# Patient Record
Sex: Male | Born: 1970 | Race: White | Hispanic: No | Marital: Married | Smoking: Never smoker
Health system: Southern US, Community
[De-identification: ages and names within clinical notes are randomized; demographics above are authoritative.]

## PROBLEM LIST (undated history)

## (undated) DIAGNOSIS — N390 Urinary tract infection, site not specified: Secondary | ICD-10-CM

## (undated) DIAGNOSIS — M419 Scoliosis, unspecified: Secondary | ICD-10-CM

## (undated) DIAGNOSIS — R011 Cardiac murmur, unspecified: Secondary | ICD-10-CM

## (undated) DIAGNOSIS — K219 Gastro-esophageal reflux disease without esophagitis: Secondary | ICD-10-CM

## (undated) DIAGNOSIS — B019 Varicella without complication: Secondary | ICD-10-CM

## (undated) DIAGNOSIS — J45909 Unspecified asthma, uncomplicated: Secondary | ICD-10-CM

## (undated) HISTORY — DX: Urinary tract infection, site not specified: N39.0

## (undated) HISTORY — DX: Cardiac murmur, unspecified: R01.1

## (undated) HISTORY — DX: Scoliosis, unspecified: M41.9

## (undated) HISTORY — DX: Unspecified asthma, uncomplicated: J45.909

## (undated) HISTORY — PX: EXTERNAL EAR SURGERY: SHX627

## (undated) HISTORY — DX: Varicella without complication: B01.9

## (undated) HISTORY — PX: TONSILLECTOMY: SUR1361

## (undated) HISTORY — DX: Gastro-esophageal reflux disease without esophagitis: K21.9

---
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
## 2003-06-24 ENCOUNTER — Inpatient Hospital Stay (HOSPITAL_COMMUNITY): Admission: EM | Admit: 2003-06-24 | Discharge: 2003-06-24 | Payer: Self-pay | Admitting: Emergency Medicine

## 2011-11-15 ENCOUNTER — Ambulatory Visit: Payer: Self-pay | Admitting: General Practice

## 2016-07-19 ENCOUNTER — Other Ambulatory Visit: Payer: Self-pay | Admitting: Otolaryngology

## 2016-07-19 DIAGNOSIS — R430 Anosmia: Secondary | ICD-10-CM

## 2017-01-05 DIAGNOSIS — L02611 Cutaneous abscess of right foot: Secondary | ICD-10-CM | POA: Diagnosis not present

## 2017-09-22 ENCOUNTER — Ambulatory Visit (HOSPITAL_COMMUNITY)
Admission: EM | Admit: 2017-09-22 | Discharge: 2017-09-22 | Disposition: A | Discharge status: Home / Self Care | Payer: No Typology Code available for payment source | Attending: Emergency Medicine | Admitting: Emergency Medicine

## 2017-09-22 ENCOUNTER — Encounter (HOSPITAL_COMMUNITY): Payer: Self-pay | Admitting: Emergency Medicine

## 2017-09-22 ENCOUNTER — Other Ambulatory Visit: Payer: Self-pay

## 2017-09-22 DIAGNOSIS — L03818 Cellulitis of other sites: Secondary | ICD-10-CM

## 2017-09-22 DIAGNOSIS — L0291 Cutaneous abscess, unspecified: Secondary | ICD-10-CM

## 2017-09-22 MED ORDER — SULFAMETHOXAZOLE-TRIMETHOPRIM 800-160 MG PO TABS: 1.0000 | ORAL_TABLET | Freq: Two times a day (BID) | ORAL | 0 refills | Status: DC

## 2017-09-22 NOTE — ED Triage Notes (Signed)
C/o two "knots" that have drained in groin area onset Monday

## 2017-09-22 NOTE — ED Provider Notes (Addendum)
[facility]    CSN: [id number] Arrival date & time: 09/22/17  [id number]     History   Chief Complaint Chief Complaint  Patient presents with  . Abscess    HPI Erik Hodge is a 47 y.o. male.   Pt states that he is on HRT testosterone cream and has to shave the groin area to apply this with a razor. Has noticed an red "pimple" area that he noticed 4 days ago. It popped and began to draining. Pt has noticed that it has caused some redness adject to area. Denies any n/v/d      History reviewed. No pertinent past medical history.  There are no active problems to display for this patient.   Past Surgical History:  Procedure Laterality Date  . EXTERNAL EAR SURGERY    . TONSILLECTOMY         Home Medications    Prior to Admission medications   Not on File    Family History No family history on file.  Social History Social History   Tobacco Use  . Smoking status: Never Smoker  . Smokeless tobacco: Never Used  Substance Use Topics  . Alcohol use: Not Currently  . Drug use: Never     Allergies   Patient has no known allergies.   Review of Systems Review of Systems  Respiratory: Negative.   Cardiovascular: Negative.   Gastrointestinal: Negative.   Genitourinary:       Pain, swelling, drainage from area on RT side of testicle,   Skin: Positive for wound.       RT side of testicle   Neurological: Negative.      Physical Exam Triage Vital Signs ED Triage Vitals  Enc Vitals Group     BP 09/22/17 1036 129/74     Pulse Rate 09/22/17 1036 66     Resp --      Temp 09/22/17 1036 98.3 F (36.8 C)     Temp Source 09/22/17 1036 Oral     SpO2 09/22/17 1036 96 %     Weight --      Height --      Head Circumference --      Peak Flow --      Pain Score 09/22/17 1033 5     Pain Loc --      Pain Edu? --      Excl. in GC? --    No data found.  Updated Vital Signs BP 129/74   Pulse 66   Temp 98.3 F (36.8 C) (Oral)   SpO2 96%    Visual Acuity     Physical Exam  Cardiovascular: Normal rate and regular rhythm.  Pulmonary/Chest: Effort normal and breath sounds normal.  Abdominal: Soft.  Genitourinary:  Genitourinary Comments: Testicle RT side has a pin point area with thick  drainage, slight erythema noted to surrounding tissues, tenderness to touch   Skin: Skin is warm. Capillary refill takes less than 2 seconds. There is erythema.  Rt testicle pin point area ,      UC Treatments / Results  Labs (all labs ordered are listed, but only abnormal results are displayed) Labs Reviewed - No data to display  EKG None Radiology No results found.  Procedures Procedures (including critical care time)  Medications Ordered in UC Medications - No data to display   Initial Impression / Assessment and Plan / UC Course  I have reviewed the triage vital signs and the nursing notes.  Pertinent  labs & imaging results that were available during my care of the patient were reviewed by me and considered in my medical decision making (see chart for details).     May use warm compresses  Avoid using tape to skin  Take full dose of abx  May take motrin or tylenol for pain  Avoid using razor to shave the area   Final Clinical Impressions(s) / UC Diagnoses   Final diagnoses:  None    ED Discharge Orders    None       Controlled Substance Prescriptions [place] Controlled Substance Registry consulted? Not Applicable   Coralyn Mark, NP 09/22/17 1122    Coralyn Mark, NP 09/22/17 1140    Coralyn Mark, NP 09/22/17 1209

## 2017-09-22 NOTE — ED Triage Notes (Signed)
And states is having irritation

## 2017-09-22 NOTE — Discharge Instructions (Signed)
May use warm compresses  Avoid using tape to skin  Take full dose of abx  May take motrin or tylenol for pain  Avoid using razor to shave the area

## 2017-11-06 ENCOUNTER — Other Ambulatory Visit (HOSPITAL_COMMUNITY)
Admission: RE | Admit: 2017-11-06 | Discharge: 2017-11-06 | Disposition: A | Discharge status: Home / Self Care | Payer: No Typology Code available for payment source | Source: Ambulatory Visit | Attending: Infectious Disease | Admitting: Infectious Disease

## 2017-11-06 ENCOUNTER — Ambulatory Visit (INDEPENDENT_AMBULATORY_CARE_PROVIDER_SITE_OTHER): Payer: No Typology Code available for payment source | Admitting: Pharmacist

## 2017-11-06 DIAGNOSIS — Z7251 High risk heterosexual behavior: Secondary | ICD-10-CM | POA: Diagnosis not present

## 2017-11-06 NOTE — Progress Notes (Signed)
Date:  11/06/2017   HPI: Erik Hodge is a 47 y.o. male who presents to the [facility] pharmacy clinic to discuss and initiate PrEP.  Insured      Uninsured     There are no active problems to display for this patient.   Patient's Medications  New Prescriptions   No medications on file  Previous Medications   No medications on file  Modified Medications   No medications on file  Discontinued Medications   SULFAMETHOXAZOLE-TRIMETHOPRIM (BACTRIM DS,SEPTRA DS) 800-160 MG TABLET    Take 1 tablet by mouth 2 (two) times daily.   TESTOSTERONE (ANDROGEL) 50 MG/5GM (1%) GEL    Place 5 g onto the skin daily.    Allergies: No Known Allergies  Past Medical History: No past medical history on file.  Social History: Social History   Socioeconomic History  . Marital status: Married    Spouse name: Not on file  . Number of children: Not on file  . Years of education: Not on file  . Highest education level: Not on file  Occupational History  . Not on file  Social Needs  . Financial resource strain: Not on file  . Food insecurity:    Worry: Not on file    Inability: Not on file  . Transportation needs:    Medical: Not on file    Non-medical: Not on file  Tobacco Use  . Smoking status: Never Smoker  . Smokeless tobacco: Never Used  Substance and Sexual Activity  . Alcohol use: Not Currently  . Drug use: Never  . Sexual activity: Not on file  Lifestyle  . Physical activity:    Days per week: Not on file    Minutes per session: Not on file  . Stress: Not on file  Relationships  . Social connections:    Talks on phone: Not on file    Gets together: Not on file    Attends religious service: Not on file    Active member of club or organization: Not on file    Attends meetings of clubs or organizations: Not on file    Relationship status: Not on file  Other Topics Concern  . Not on file  Social History Narrative  . Not on file    CHL HIV PREP FLOWSHEET RESULTS  11/06/2017  Insurance Status Insured  Gender at birth Male  Gender identity cis-Male  Risk for HIV In sexual relationship with HIV+ partner  Sex Partners Women only  # sex partners past 3-6 mos 1-3  Sex activity preferences Insertive  Condom use No  Treated for STI? No  HIV symptoms? N/A  PrEP Eligibility Substantial risk for HIV    Labs:  SCr: No results found for: CREATININE HIV No results found for: HIV Hepatitis B No results found for: HEPBSAB, HEPBSAG, HEPBCAB Hepatitis C No results found for: HEPCAB, HCVRNAPCRQN Hepatitis A No results found for: HAV RPR and STI No results found for: LABRPR, RPRTITER  No flowsheet data found.  HIV Pre-Exposure Prophylaxis (PrEP): Patient's risk for HIV: HIV + wife Sexual partner preference: women Number of sexual partners in the last 3 months: 1 Condom use? no Symptoms of acute HIV? none Last date of BMET: N/A Last date of STI testing: N/A Last negative HIV antibody test: N/A  Assessment: [name] is here today to discuss and initiate PrEP.  His wife is newly diagnosed and was just in the hospital with pneumonia and diagnosed with HIV at that time.  Her CD4 count was 10 at diagnosis, and she was started on Biktarvy by Marcos Eke, our ID NP.  He currently has condomless sex with his wife and is worried about being HIV positive.  His oral swab was negative for HIV a few weeks ago.   I discussed PrEP with him including the need to take it daily to prevent HIV transmission.  I also explained to him that it is very important that his wife takes her Biktarvy every day.  I explained that once her HIV viral load is undetectable, then the risk of HIV transmission is extremely low. I also explained that if her viral load was undetectable and he was taking Truvada for PrEP, then his risk is essentially zero.  He is very interested in starting.  I also discussed long term kidney and bone damage with Truvada and explained that Descovy will hopefully  be approved soon.  He does not have any other medical conditions and is only taking testosterone cream. He stated to me that he was worried about his kidney function because he used to take anabolic steroids a lot.  He has never been told he had kidney issues or anything. I will get all baseline testing today including urine gc/chlamydia testing, RPR, hepatitis serologies, BMET, and HIV antibody.  I will also send a HIV viral load since he is very worried and has been having unprotected sex with his wife who just started on medications. [facility] has [facility], so he will fill at [facility] when his labs come back.  I will see him again in ~30 days then every 3 months after that.  Plan: - STI testing, BMET, Hepatitis serologies, HIV antibody - Truvada x 30 days if HIV negative - F/u with me again 6/13 at 9am  Clover Feehan L. Carle Fenech, PharmD, AAHIVP, CPP Infectious Diseases Clinical Pharmacist [facility] 11/06/2017, 10:57 AM

## 2017-11-07 ENCOUNTER — Telehealth: Payer: Self-pay | Admitting: Pharmacist

## 2017-11-07 DIAGNOSIS — Z7251 High risk heterosexual behavior: Secondary | ICD-10-CM

## 2017-11-07 LAB — HIV ANTIBODY (ROUTINE TESTING W REFLEX): HIV 1&2 Ab, 4th Generation: NONREACTIVE

## 2017-11-07 LAB — BASIC METABOLIC PANEL
BUN: 14 mg/dL (ref 7–25)
CALCIUM: 9.3 mg/dL (ref 8.6–10.3)
CHLORIDE: 105 mmol/L (ref 98–110)
CO2: 24 mmol/L (ref 20–32)
Creat: 1.11 mg/dL (ref 0.60–1.35)
Glucose, Bld: 108 mg/dL — ABNORMAL HIGH (ref 65–99)
POTASSIUM: 4.5 mmol/L (ref 3.5–5.3)
SODIUM: 140 mmol/L (ref 135–146)

## 2017-11-07 LAB — HEPATITIS C ANTIBODY
Hepatitis C Ab: NONREACTIVE
SIGNAL TO CUT-OFF: 0.06 (ref <1.00)

## 2017-11-07 LAB — HEPATITIS B SURFACE ANTIBODY,QUALITATIVE: HEP B S AB: NONREACTIVE

## 2017-11-07 LAB — HEPATITIS B SURFACE ANTIGEN: Hepatitis B Surface Ag: NONREACTIVE

## 2017-11-07 LAB — URINE CYTOLOGY ANCILLARY ONLY
Chlamydia: NEGATIVE
Neisseria Gonorrhea: NEGATIVE

## 2017-11-07 LAB — HEPATITIS A ANTIBODY, TOTAL: HEPATITIS A AB,TOTAL: NONREACTIVE

## 2017-11-07 LAB — RPR: RPR Ser Ql: NONREACTIVE

## 2017-11-07 MED ORDER — EMTRICITABINE-TENOFOVIR DF 200-300 MG PO TABS
1.0000 | ORAL_TABLET | Freq: Every day | ORAL | 0 refills | Status: DC
Start: 2017-11-07 — End: 2019-09-24

## 2017-11-07 NOTE — Telephone Encounter (Signed)
Called [name] and left a message that his lab results (HIV antibody) was negative.  Will send in 30 days of Truvada to [facility].

## 2017-11-08 ENCOUNTER — Telehealth: Payer: Self-pay | Admitting: Pharmacist Clinician (PhC)/ Clinical Pharmacy Specialist

## 2017-11-08 LAB — HIV-1 RNA ULTRAQUANT REFLEX TO GENTYP+

## 2017-11-08 NOTE — Telephone Encounter (Signed)
Erik Hodge called back to ask if he can wait to start PrEP next month when he comes here for a repeat test. He really wants to make sure that he doesn't have HIV first because of unprotected sex before his wife found out her status. Told him it was fine to delay start until the next visit on 6/13.

## 2017-12-07 ENCOUNTER — Ambulatory Visit: Payer: No Typology Code available for payment source

## 2018-02-08 ENCOUNTER — Ambulatory Visit (INDEPENDENT_AMBULATORY_CARE_PROVIDER_SITE_OTHER): Payer: No Typology Code available for payment source | Admitting: Physician Assistant

## 2018-02-08 ENCOUNTER — Encounter: Payer: Self-pay | Admitting: Physician Assistant

## 2018-02-08 VITALS — BP 140/88 | HR 76 | Temp 98.0°F | Ht 70.0 in | Wt 191.0 lb

## 2018-02-08 DIAGNOSIS — M419 Scoliosis, unspecified: Secondary | ICD-10-CM

## 2018-02-08 DIAGNOSIS — A630 Anogenital (venereal) warts: Secondary | ICD-10-CM

## 2018-02-08 DIAGNOSIS — Z7689 Persons encountering health services in other specified circumstances: Secondary | ICD-10-CM | POA: Diagnosis not present

## 2018-02-08 DIAGNOSIS — M5441 Lumbago with sciatica, right side: Secondary | ICD-10-CM | POA: Diagnosis not present

## 2018-02-08 DIAGNOSIS — G8929 Other chronic pain: Secondary | ICD-10-CM

## 2018-02-08 DIAGNOSIS — Z114 Encounter for screening for human immunodeficiency virus [HIV]: Secondary | ICD-10-CM | POA: Diagnosis not present

## 2018-02-08 DIAGNOSIS — Z7251 High risk heterosexual behavior: Secondary | ICD-10-CM

## 2018-02-08 NOTE — Patient Instructions (Signed)
It was great to see you!  You will be contacted about your referral to dermatology. You can make an appointment with Dr. Berline Choughigby at your convenience starting as early as tomorrow.  Let's follow-up in 1 month for a physical exam, sooner if you have concerns.  Take care,  Jarold MottoSamantha Kortland Nichols PA-C

## 2018-02-08 NOTE — Progress Notes (Signed)
Erik Hodge is a 47 y.o. male here for a new problem.   History of Present Illness:   Chief Complaint  Patient presents with  . New Patient (Initial Visit)  . Establish Care  . Back Pain  . Genital Warts    Acute Concerns: Back pain -- R lower lumbar area, has shooting pain down his R leg --> thinks that 3-4 months ago he pulled something when dead-lifting at the gym. Put inserts in his shoes yesterday and this helped. Saw a chiropractor for an eval and was told that the chiropractor is requiring a referral due to his insurance. Currently taking Aleve or Tylenol with some relief. Does not want to take strong pain medications (strong family history of addiction.) Genital warts -- 1 lesion a year ago and then a few more recently. 15-20 years ago had one that was "burned" off. Not painful. No discharge from penis. Recent STI testing in May was normal. High risk heterosexual behavior -- wife recently diagnosed with HIV earlier this year. He was prescribed Truvada by ID but never started it. He was unable to go back for a repeat HIV test to confirm negative prior to starting. Labs were most recently performed in May.  Health Maintenance: Immunizations -- requesting records Weight -- Weight: 191 lb (86.6 kg)   Depression screen [facility] 2/9 02/08/2018  Decreased Interest 0  Down, Depressed, Hopeless 0  PHQ - 2 Score 0    No flowsheet data found.  Other providers/specialists: Erik Eke -- ID  Past Medical History:  Diagnosis Date  . Asthma    as a child  . Chicken pox   . GERD (gastroesophageal reflux disease)   . Heart murmur    as a child  . Scoliosis    undiagnosed until 60 y/o, no surgery  . UTI (urinary tract infection)      Social History   Socioeconomic History  . Marital status: Married    Spouse name: Not on file  . Number of children: Not on file  . Years of education: Not on file  . Highest education level: Not on file  Occupational History  . Occupation:  Database administrator  Social Needs  . Financial resource strain: Not on file  . Food insecurity:    Worry: Not on file    Inability: Not on file  . Transportation needs:    Medical: Not on file    Non-medical: Not on file  Tobacco Use  . Smoking status: Never Smoker  . Smokeless tobacco: Never Used  Substance and Sexual Activity  . Alcohol use: Yes    Comment: Socially drinks (1 q2 wks)  . Drug use: Never  . Sexual activity: Yes  Lifestyle  . Physical activity:    Days per week: Not on file    Minutes per session: Not on file  . Stress: Not on file  Relationships  . Social connections:    Talks on phone: Not on file    Gets together: Not on file    Attends religious service: Not on file    Active member of club or organization: Not on file    Attends meetings of clubs or organizations: Not on file    Relationship status: Not on file  . Intimate partner violence:    Fear of current or ex partner: Not on file    Emotionally abused: Not on file    Physically abused: Not on file    Forced sexual activity: Not on file  Other Topics Concern  . 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 EXTERNAL EAR SURGERY    . eye surgery  1984  . INNER EAR SURGERY  1978  . TONSILECTOMY, ADENOIDECTOMY, BILATERAL MYRINGOTOMY AND TUBES  1976  . TONSILLECTOMY      Family History  Problem Relation Age of Onset  . Alcohol abuse Brother   . Depression Brother   . Drug abuse Brother   . Arthritis Maternal Grandmother   . Alcohol abuse Maternal Grandfather   . Kidney disease Maternal Grandfather   . Colon cancer Neg Hx   . Prostate cancer Neg Hx     No Known Allergies   Current Medications:   Current Outpatient Medications:  .  testosterone (ANDROGEL) 50 MG/5GM (1%) GEL, Place 5 g  onto the skin daily., Disp: , Rfl:  .  emtricitabine-tenofovir (TRUVADA) 200-300 MG tablet, Take 1 tablet by mouth daily., Disp: 30 tablet, Rfl: 0   Review of Systems:   ROS  Negative unless otherwise specified per HPI.  Vitals:   Vitals:   02/08/18 0814  BP: 140/88  Pulse: 76  Temp: 98 F (36.7 C)  TempSrc: Oral  SpO2: 95%  Weight: 191 lb (86.6 kg)  Height: 5\' 10"  (1.778 m)     Body mass index is 27.41 kg/m.  Physical Exam:   Physical Exam  Constitutional: He appears well-developed. He is cooperative.  Non-toxic appearance. He does not have a sickly appearance. He does not appear ill. No distress.  Cardiovascular: Normal rate, regular rhythm, S1 normal, S2 normal, normal heart sounds and normal pulses.  No LE edema  Pulmonary/Chest: Effort normal and breath sounds normal.  Genitourinary:  Genitourinary Comments: Numerous condyloma around base of shaft of penis; no penile discharge present. Lesions without tenderness or erythema.  Musculoskeletal:  Severely curved cervical spine with obvious deformity  No decreased ROM 2/2 pain with flexion/extension, lateral side bends, or rotation. Reproducible tenderness with deep palpation to right lumbar paraspinal muscles. No bony tenderness. No evidence of erythema, rash or ecchymosis.    Neurological: He is alert. GCS eye subscore is 4. GCS verbal subscore is 5. GCS motor subscore is 6.  Skin: Skin is warm, dry and intact.  Psychiatric: He has a normal mood and affect. His speech is normal and behavior is normal.  Nursing note and vitals reviewed.     Assessment and Plan:    Erik Hodge was seen today for new patient (initial visit), establish care, back pain and genital warts.  Diagnoses and all orders for this visit:  Encounter to establish care  Screening for HIV (human immunodeficiency virus) -     HIV antibody  Chronic right-sided low back pain with right-sided sciatica; Scoliosis, unspecified scoliosis type,  unspecified spinal region -     Ambulatory referral to Sports Medicine  Genital warts -     Ambulatory referral to Dermatology  High risk heterosexual behavior Will check HIV today per patient's request. I have reached out to Erik EkeGreg Calone, NP in ID for further recommendations on proceeding with Truvada -- may need to update labs.  . Reviewed expectations re: course of current medical issues. . Discussed self-management of symptoms. . Outlined signs and symptoms indicating need  for more acute intervention. . Patient verbalized understanding and all questions were answered. . See orders for this visit as documented in the electronic medical record. . Patient received an After-Visit Summary.    Jarold MottoSamantha Salomon Ganser, PA-C

## 2018-02-09 LAB — HIV ANTIBODY (ROUTINE TESTING W REFLEX): HIV: NONREACTIVE

## 2018-02-14 ENCOUNTER — Ambulatory Visit (INDEPENDENT_AMBULATORY_CARE_PROVIDER_SITE_OTHER): Payer: No Typology Code available for payment source | Admitting: Sports Medicine

## 2018-02-14 ENCOUNTER — Encounter: Payer: Self-pay | Admitting: Sports Medicine

## 2018-02-14 ENCOUNTER — Ambulatory Visit (INDEPENDENT_AMBULATORY_CARE_PROVIDER_SITE_OTHER): Payer: No Typology Code available for payment source

## 2018-02-14 VITALS — BP 120/80 | HR 72 | Ht 70.0 in | Wt 191.4 lb

## 2018-02-14 DIAGNOSIS — M5441 Lumbago with sciatica, right side: Secondary | ICD-10-CM | POA: Diagnosis not present

## 2018-02-14 DIAGNOSIS — M419 Scoliosis, unspecified: Secondary | ICD-10-CM

## 2018-02-14 DIAGNOSIS — M9903 Segmental and somatic dysfunction of lumbar region: Secondary | ICD-10-CM

## 2018-02-14 DIAGNOSIS — M9902 Segmental and somatic dysfunction of thoracic region: Secondary | ICD-10-CM

## 2018-02-14 DIAGNOSIS — M24551 Contracture, right hip: Secondary | ICD-10-CM | POA: Diagnosis not present

## 2018-02-14 DIAGNOSIS — G8929 Other chronic pain: Secondary | ICD-10-CM | POA: Diagnosis not present

## 2018-02-14 DIAGNOSIS — M9905 Segmental and somatic dysfunction of pelvic region: Secondary | ICD-10-CM

## 2018-02-14 DIAGNOSIS — M9908 Segmental and somatic dysfunction of rib cage: Secondary | ICD-10-CM

## 2018-02-14 DIAGNOSIS — M9904 Segmental and somatic dysfunction of sacral region: Secondary | ICD-10-CM

## 2018-02-14 NOTE — Patient Instructions (Addendum)

## 2018-02-14 NOTE — Progress Notes (Signed)
Erik FellsMichael D. Delorise Shinerigby, DO  [facility] [phone]  Erik Hodge [place] - 47 y.o. male MRN [id number]  Date of birth: 01/14/1971  Visit Date: 02/14/2018  PCP: Jarold MottoWorley, Samantha, PA   Referred by: Jarold MottoWorley, Samantha, [place]  Scribe(Hodge) for today'Hodge visit: Stevenson ClinchBrandy Coleman, CMA  SUBJECTIVE:  Erik Hodge Newcom "Erik Hodge" is here for Initial Assessment (R-sided LBP) .  Referred by: Jarold MottoSamantha Worley, PA  His R-sided LBP symptoms INITIALLY: Began 3-4 mos ago after doing dead lifts at the gym. He has been dx with scoliosis.  Described as moderate stiffness and doreness, radiating to the R leg to the heel but not into the toes. He reports a "grinding" sensation in the heel. Pain can be severe at times.  Worsened with sit-to-stand, bending at the waist.  Improved with rest. Additional associated symptoms include:     At this time symptoms show no change compared to onset, waxes and wanes.  He has been taking Aleve or Tylenol with some relief. He placed inserts into his shoes which did help a lot. He tried to see a [facility]ropractor but he didn't have a referral so they wouldn't see him.  No recent XR L-spine   REVIEW OF SYSTEMS: Reports night time disturbances. Denies fevers, chills, or night sweats. Denies unexplained weight loss. Denies personal history of cancer. Denies changes in bowel or bladder habits. Denies recent unreported falls. Denies new or worsening dyspnea or wheezing. Denies headaches or dizziness.  Reports numbness, tingling in RLE - reports mild weakness.  Denies dizziness or presyncopal episodes Denies lower extremity edema    HISTORY & PERTINENT PRIOR DATA:  Significant/pertinent history, findings, studies include:  reports that he has never smoked. He has never used smokeless tobacco. No results for input(Hodge): HGBA1C, LABURIC, CREATINE in the last 8760 hours. No specialty comments available. No problems updated.  Otherwise prior  history reviewed and updated per electronic medical record.    OBJECTIVE:  VS:  HT:5\' 10"  (177.8 cm)   WT:191 lb 6.4 oz (86.8 kg)  BMI:27.46    BP:120/80  HR:72bpm  TEMP: ( )  RESP:95 %   PHYSICAL EXAM: CONSTITUTIONAL: Well-developed, Well-nourished and In no acute distress Alert & appropriately interactive. and Not depressed or anxious appearing. RESPIRATORY: No increased work of breathing and Trachea Midline EYES: Pupils are equal., EOM intact without nystagmus. and No scleral icterus.  Lower extremities: Warm and well perfused Edema: No significant swelling or edema NEURO: unremarkable Normal associated myotomal distribution strength to manual muscle testing Normal sensation to light touch Normal and symmetric associated DTRs  BACK Exam: Very mild scoliosis with moderate thoracic kyphosis. Skin: No overlying erythema/ecchymosis  MOTOR TESTING: Intact in all LE myotomes and Able to heel and toe walk without difficutly        RIGHT    LEFT Straight leg raise-------------------------: normal, no pain                         normal, no pain       REFLEXES Right Left  DTR - L3/4 -Patellar 2+ 2+  DTR - L5/S1 - Achilles 2+ 2+     PROCEDURES & DATA REVIEWED:  X-Rays obtained today and reviewed with the patient that showed: Complete lumbar spine films show slight scoliosis with mild degenerative changes of the lower lumbar segments most focally L5-S1 as well as mild facet arthropathy.   ASSESSMENT   1. Chronic right-sided low back pain  with right-sided sciatica   2. Scoliosis, unspecified scoliosis type, unspecified spinal region   3. Right hip flexor tightness   4. Somatic dysfunction of thoracic region   5. Somatic dysfunction of lumbar region   6. Somatic dysfunction of rib cage region   7. Somatic dysfunction of pelvis region   8. Somatic dysfunction of sacral region     PLAN:          Functional low back pain  Tight hip flexors  Osteopathic  manipulation was performed today based on physical exam findings.  Please see procedure note for further information including Osteopathic Exam findings  Links to [employer] videos provided today per Patient Instructions.  These exercises were developed by Myles LippsEric Goodman, DC with a strong emphasis on core neuromuscular reducation and postural realignment through body-weight exercises. No problem-specific Assessment & Plan notes found for this encounter.   Follow-up: Return in about 2 weeks (around 02/28/2018) for consideration of repeat Osteopathic Manipulation.      Please see additional documentation for Objective, Assessment and Plan sections. Pertinent additional documentation may be included in corresponding procedure notes, imaging studies, problem based documentation and patient instructions. Please see these sections of the encounter for additional information regarding this visit.  CMA/ATC served as Neurosurgeonscribe during this visit. History, Physical, and Plan performed by medical provider. Documentation and orders reviewed and attested to.      [name], DO    [place] [facility] Physician

## 2018-02-14 NOTE — Progress Notes (Signed)
PROCEDURE NOTE : OSTEOPATHIC MANIPULATION The decision today to treat with Osteopathic Manipulative Therapy (OMT) was based on physical exam findings. Verbal consent was obtained following a discussion with the patient regarding the of risks, benefits and potential side effects, including an acute pain flare,post manipulation soreness and need for repeat treatments.     Contraindications to OMT: NONE  Manipulation was performed as below: Regions treated: Thoracic spine, Lumbar spine, Pelvis and Sacrum OMT Techniques Used: HVLA, muscle energy and myofascial release  The patient tolerated the treatment well and reported Improved symptoms following treatment today. Patient was given medications, exercises, stretches and lifestyle modifications per AVS and verbally.   OSTEOPATHIC/STRUCTURAL EXAM:   T4 - T7 Neutral, Rotated right, Sidebent left T8 FRS left (Flexed, Rotated & Sidebent) Rib 6 Left  Posterior L3 FRS right (Flexed, Rotated & Sidebent) L5 FRS left (Flexed, Rotated & Sidebent) Right psoas spasm Right anterior innonimate L on L sacral torsion

## 2018-03-01 ENCOUNTER — Ambulatory Visit: Payer: No Typology Code available for payment source | Admitting: Sports Medicine

## 2018-03-02 ENCOUNTER — Encounter: Payer: Self-pay | Admitting: Sports Medicine

## 2018-03-12 ENCOUNTER — Ambulatory Visit: Payer: No Typology Code available for payment source | Admitting: Physician Assistant

## 2018-03-19 ENCOUNTER — Ambulatory Visit: Payer: No Typology Code available for payment source | Admitting: Sports Medicine

## 2019-02-27 IMAGING — DX DG LUMBAR SPINE COMPLETE 4+V
5 series · 5 of 5 positions shown · non-contrast
Comparison: 11/15/2011 lumbar spine radiograph

CLINICAL DATA: 47 y/o M; chronic right-sided lower back pain. No
known injury. Pain radiates to the right heel.

EXAM:
LUMBAR SPINE - COMPLETE 4+ VIEW

[lumbar spine ap]
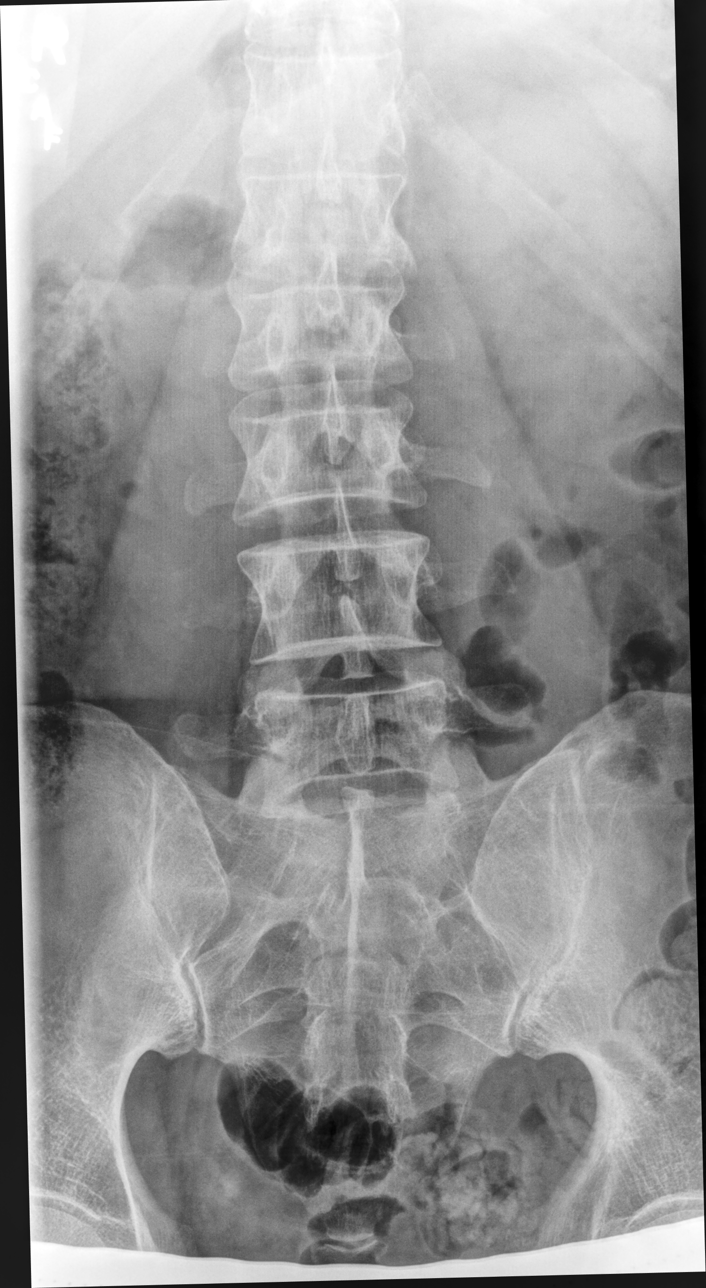

[lumbar spine oblique (1 of 2)]
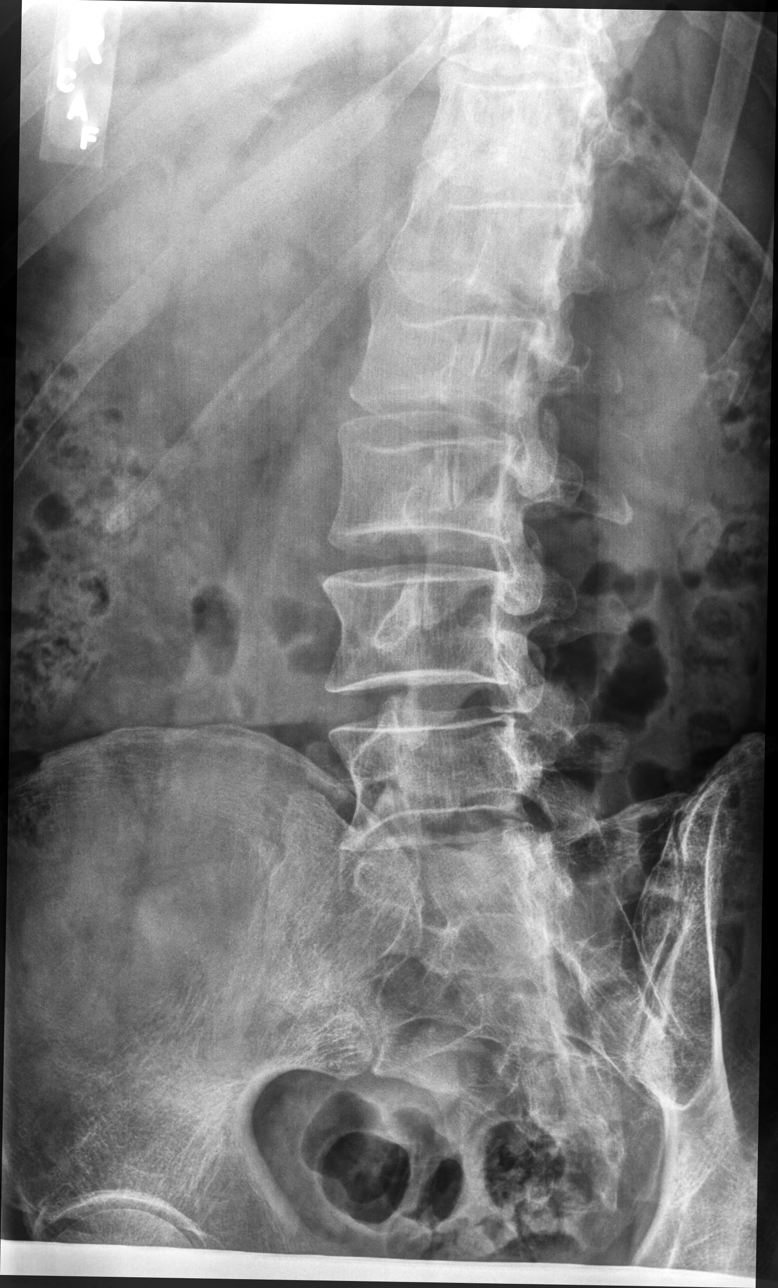

[lumbar spine oblique (2 of 2)]
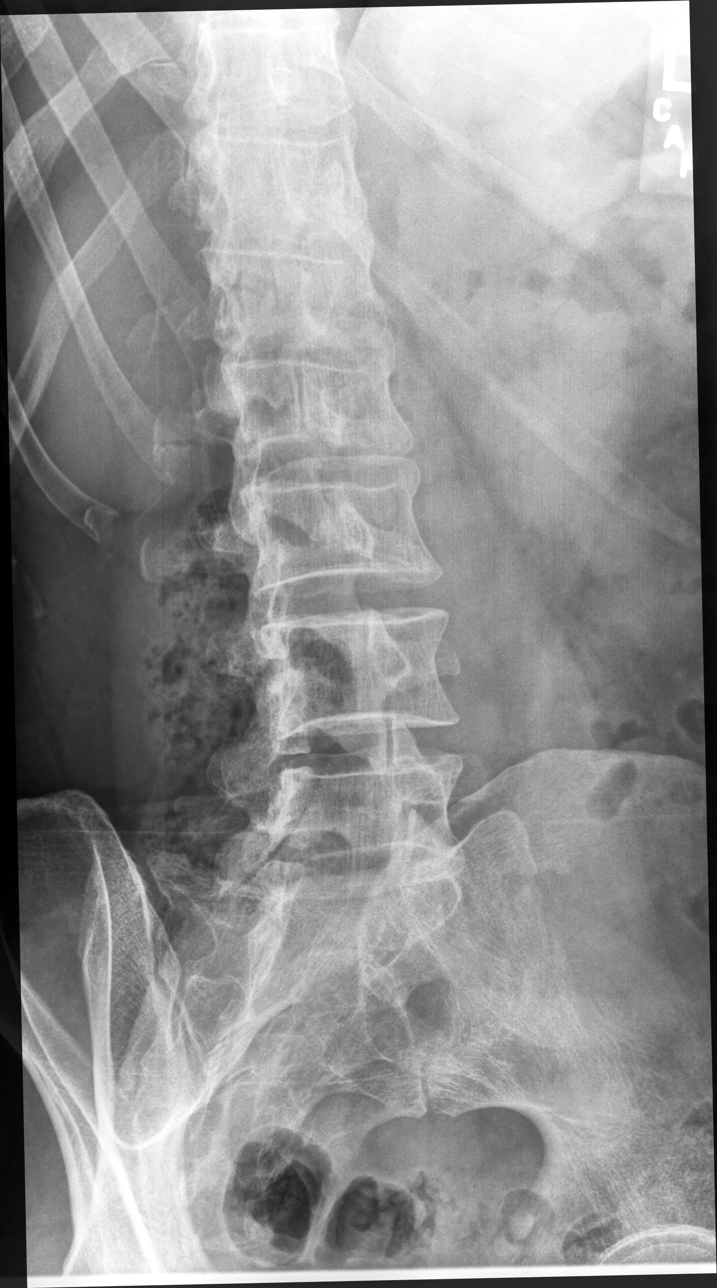

[lumbar spine lat (1 of 2)]
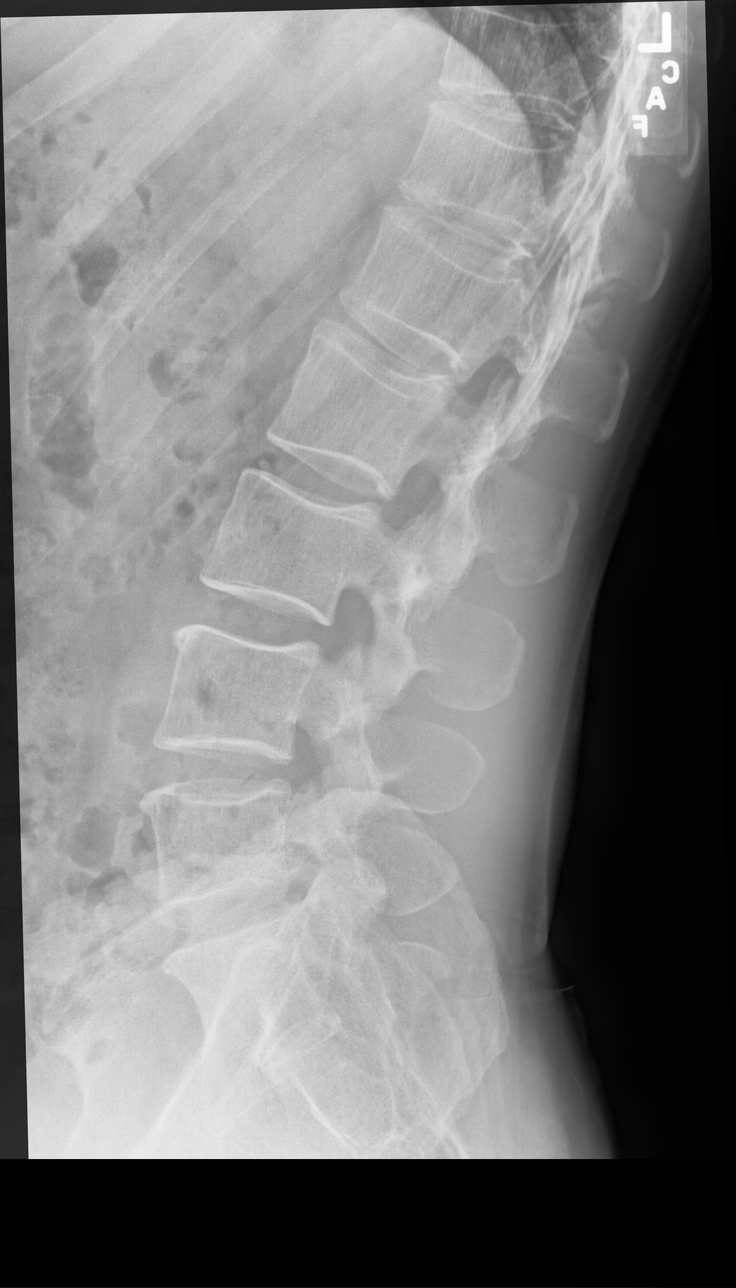

[lumbar spine lat (2 of 2)]
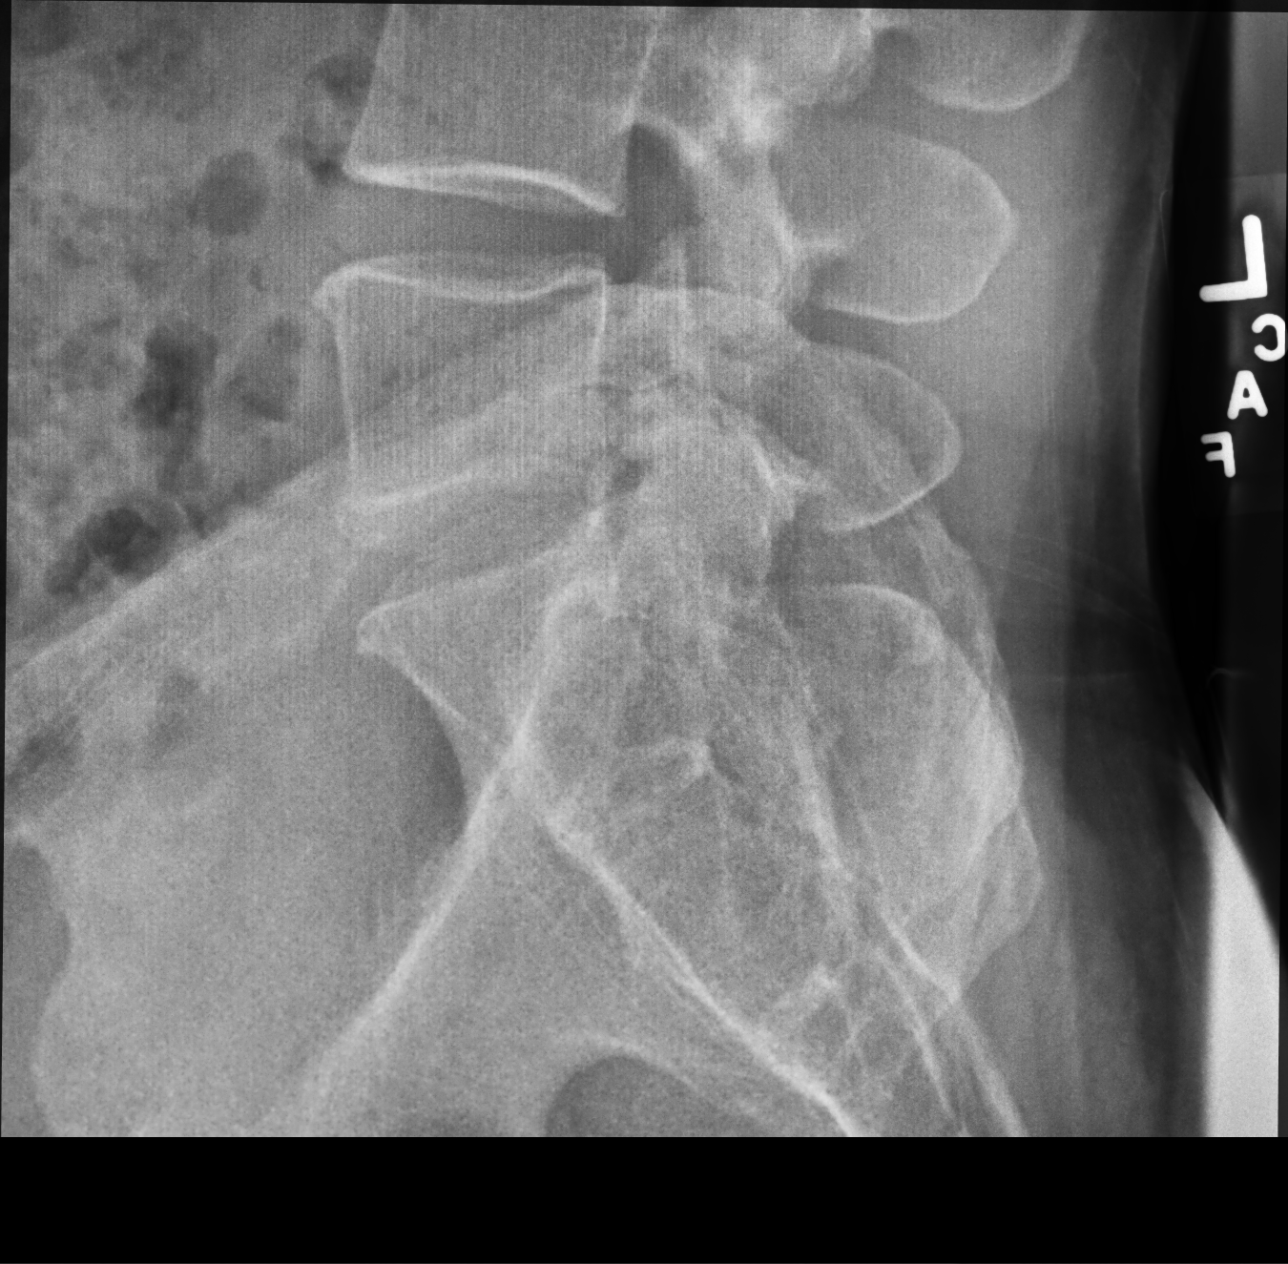

[5 of 5 positions shown; findings below may reference images not displayed]

FINDINGS: Five lumbar type non-rib-bearing vertebral bodies. Mild stable
lumbar dextrocurvature with apex at L2-3. No pars defect. Normal
lumbar lordosis without listhesis. No significant loss of vertebral
body or disc space height.
IMPRESSION: 1. Mild lumbar spine dextrocurvature.
2. No acute fracture or loss of vertebral body height.

By: Edicta Cambiaso M.D.

## 2019-09-23 ENCOUNTER — Other Ambulatory Visit: Payer: Self-pay

## 2019-09-23 ENCOUNTER — Telehealth: Payer: Self-pay | Admitting: Pharmacy Technician

## 2019-09-23 ENCOUNTER — Ambulatory Visit (INDEPENDENT_AMBULATORY_CARE_PROVIDER_SITE_OTHER): Payer: Commercial Managed Care - PPO | Admitting: Pharmacist

## 2019-09-23 DIAGNOSIS — Z7251 High risk heterosexual behavior: Secondary | ICD-10-CM | POA: Diagnosis not present

## 2019-09-23 DIAGNOSIS — A630 Anogenital (venereal) warts: Secondary | ICD-10-CM

## 2019-09-23 MED ORDER — IMIQUIMOD 5 % EX CREA: TOPICAL_CREAM | CUTANEOUS | 0 refills | Status: AC

## 2019-09-23 NOTE — Telephone Encounter (Signed)
[facility] Patient Product/process development scientist completed.    The patient is insured through [facility] and has a $30 copay for DESCOVY.  We will continue to follow to see if copay assistance is needed.  Netty Starring. Dimas Aguas CPhT Specialty Pharmacy Patient [facility] Phone: [phone] Fax:  [phone]

## 2019-09-23 NOTE — Progress Notes (Addendum)
Date:  09/24/2019   HPI: Erik Hodge is a 49 y.o. male who presents [facility] for PrEP follow-up.  Insured   [x]    Uninsured  []    There are no problems to display for this patient.   Patient's Medications  New Prescriptions   IMIQUIMOD (ALDARA) 5 % CREAM    Apply topically 3 (three) times a week.  Previous Medications   EMTRICITABINE-TENOFOVIR (TRUVADA) 200-300 MG TABLET    Take 1 tablet by mouth daily.   TESTOSTERONE (ANDROGEL) 50 MG/5GM (1%) GEL    Place 5 g onto the skin daily.  Modified Medications   No medications on file  Discontinued Medications   No medications on file    Allergies: No Known Allergies  Past Medical History: Past Medical History:  Diagnosis Date  . Asthma    as a child  . Chicken pox   . GERD (gastroesophageal reflux disease)   . Heart murmur    as a child  . Scoliosis    undiagnosed until 4 y/o, no surgery  . UTI (urinary tract infection)     Social History: Social History   Socioeconomic History  . Marital status: Married    Spouse name: Not on file  . Number of children: Not on file  . Years of education: Not on file  . Highest education level: Not on file  Occupational History  . Occupation: Technican  Tobacco Use  . Smoking status: Never Smoker  . Smokeless tobacco: Never Used  Substance and Sexual Activity  . Alcohol use: Yes    Comment: Socially drinks (1 q2 wks)  . Drug use: Never  . Sexual activity: Yes  Other Topics Concern  . Not on file  Social History Narrative   From [place]   Married x 1 year   Air cabin crew   Wife works in [employer]   Has 3 boys and 1 grandkids   Social Determinants of Radio broadcast assistant Strain:   . Difficulty of Paying Living Expenses:   Food Insecurity:   . Worried About Charity fundraiser in the Last Year:   . Arboriculturist in the Last Year:   Transportation Needs:   . Film/video editor  (Medical):   Marland Kitchen Lack of Transportation (Non-Medical):   Physical Activity:   . Days of Exercise per Week:   . Minutes of Exercise per Session:   Stress:   . Feeling of Stress :   Social Connections:   . Frequency of Communication with Friends and Family:   . Frequency of Social Gatherings with Friends and Family:   . Attends Religious Services:   . Active Member of Clubs or Organizations:   . Attends Archivist Meetings:   Marland Kitchen Marital Status:     CHL HIV PREP FLOWSHEET RESULTS 11/06/2017  Insurance Status Insured  Gender at birth Male  Gender identity cis-Male  Risk for HIV In sexual relationship with HIV+ partner  Sex Partners Women only  # sex partners past 3-6 mos 1-3  Sex activity preferences Insertive  Condom use No  Treated for STI? No  HIV symptoms? N/A  PrEP Eligibility Substantial risk for HIV    Labs:  SCr: Lab Results  Component Value Date   CREATININE 1.14 09/23/2019   CREATININE 1.11 11/06/2017   HIV Lab Results  Component Value Date   HIV NON-REACTIVE 09/23/2019   HIV NON-REACTIVE  02/08/2018   HIV NON-REACTIVE 11/06/2017   Hepatitis B Lab Results  Component Value Date   HEPBSAB NON-REACTIVE 11/06/2017   HEPBSAG NON-REACTIVE 11/06/2017   Hepatitis C Lab Results  Component Value Date   HEPCAB NON-REACTIVE 11/06/2017   Hepatitis A Lab Results  Component Value Date   HAV NON-REACTIVE 11/06/2017   RPR and STI Lab Results  Component Value Date   LABRPR NON-REACTIVE 09/23/2019   LABRPR NON-REACTIVE 11/06/2017    STI Results GC CT  11/06/2017 Negative Negative    Assessment: Erik Hodge is here today to discuss HIV testing and restarting PrEP. I last saw him in 2019 right after his wife was diagnosed with HIV.  He tells me today that he never picked up the Descovy prescription after seeing me. Unfortunately, his wife passed away on 07-10-2023 ultimately due to a fungal pneumonia he tells me. They were sexually active but used condoms most of the  time. However, the condom did break during one encounter.  He is very worried about HIV. He wants to be tested today to be sure. He is also worried about herpes.  I explained that we usually do not test for herpes unless he has an active lesion, which he does not and never has. 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  He is suffering from them now, so I will prescribe Aldara cream for him.   He states that he is interested in being on PrEP in the future.  I explained Descovy to him and how it is safer long term for his kidneys and bone health.  He will need to go to his primary care, urgent care, or the health department to be tested next time if he does not continue to see me for PrEP.  Plan: - HIV antibody, BMET, RPR, urine cytology today - Aldara cream 5% - apply three times weekly - Descovy x 3 months if HIV negative - F/u with me in 3 months if he decides to start PrEP  Jeffifer Rabold L. Sayler Mickiewicz, PharmD, BCIDP, AAHIVP, CPP Clinical Pharmacist Practitioner Infectious Diseases Clinical Pharmacist [facility] 09/24/2019, 11:41 AM

## 2019-09-24 ENCOUNTER — Other Ambulatory Visit: Payer: Self-pay | Admitting: Pharmacist

## 2019-09-24 LAB — BASIC METABOLIC PANEL
BUN: 13 mg/dL (ref 7–25)
CO2: 25 mmol/L (ref 20–32)
Calcium: 10.1 mg/dL (ref 8.6–10.3)
Chloride: 106 mmol/L (ref 98–110)
Creat: 1.14 mg/dL (ref 0.60–1.35)
Glucose, Bld: 94 mg/dL (ref 65–99)
Potassium: 4 mmol/L (ref 3.5–5.3)
Sodium: 140 mmol/L (ref 135–146)

## 2019-09-24 LAB — RPR: RPR Ser Ql: NONREACTIVE

## 2019-09-24 LAB — HIV ANTIBODY (ROUTINE TESTING W REFLEX): HIV 1&2 Ab, 4th Generation: NONREACTIVE

## 2019-09-24 MED ORDER — DESCOVY 200-25 MG PO TABS: 1.0000 | ORAL_TABLET | Freq: Every day | ORAL | 2 refills | Status: AC

## 2019-09-24 NOTE — Progress Notes (Signed)
Called patient to relay negative HIV results. He is possibly interested in being on PrEP. Sent Descovy to [facility]. He will call me in the future for a follow up if needed.

## 2019-09-25 LAB — URINE CYTOLOGY ANCILLARY ONLY
Chlamydia: NEGATIVE
Comment: NEGATIVE
Comment: NORMAL
Neisseria Gonorrhea: NEGATIVE

## 2019-11-07 ENCOUNTER — Ambulatory Visit (INDEPENDENT_AMBULATORY_CARE_PROVIDER_SITE_OTHER): Payer: Commercial Managed Care - PPO | Admitting: Otolaryngology

## 2020-11-27 ENCOUNTER — Ambulatory Visit (HOSPITAL_COMMUNITY)
Admission: EM | Admit: 2020-11-27 | Discharge: 2020-11-27 | Discharge status: Against Medical Advice | Payer: Commercial Managed Care - PPO

## 2020-11-27 ENCOUNTER — Other Ambulatory Visit: Payer: Self-pay

## 2021-10-12 DIAGNOSIS — Z125 Encounter for screening for malignant neoplasm of prostate: Secondary | ICD-10-CM | POA: Diagnosis not present

## 2021-10-12 DIAGNOSIS — D582 Other hemoglobinopathies: Secondary | ICD-10-CM | POA: Diagnosis not present

## 2021-10-12 DIAGNOSIS — E782 Mixed hyperlipidemia: Secondary | ICD-10-CM | POA: Diagnosis not present

## 2021-10-12 DIAGNOSIS — N529 Male erectile dysfunction, unspecified: Secondary | ICD-10-CM | POA: Diagnosis not present

## 2021-10-12 DIAGNOSIS — Z0000 Encounter for general adult medical examination without abnormal findings: Secondary | ICD-10-CM | POA: Diagnosis not present

## 2021-10-12 DIAGNOSIS — E291 Testicular hypofunction: Secondary | ICD-10-CM | POA: Diagnosis not present

## 2021-11-26 DIAGNOSIS — R0981 Nasal congestion: Secondary | ICD-10-CM | POA: Diagnosis not present

## 2021-11-26 DIAGNOSIS — J310 Chronic rhinitis: Secondary | ICD-10-CM | POA: Diagnosis not present

## 2021-11-26 DIAGNOSIS — T485X5A Adverse effect of other anti-common-cold drugs, initial encounter: Secondary | ICD-10-CM | POA: Diagnosis not present

## 2021-11-26 DIAGNOSIS — J342 Deviated nasal septum: Secondary | ICD-10-CM | POA: Diagnosis not present

## 2022-02-03 DIAGNOSIS — J3489 Other specified disorders of nose and nasal sinuses: Secondary | ICD-10-CM | POA: Diagnosis not present

## 2022-02-03 DIAGNOSIS — J310 Chronic rhinitis: Secondary | ICD-10-CM | POA: Diagnosis not present

## 2022-02-09 DIAGNOSIS — J310 Chronic rhinitis: Secondary | ICD-10-CM | POA: Diagnosis not present

## 2022-02-09 DIAGNOSIS — J3489 Other specified disorders of nose and nasal sinuses: Secondary | ICD-10-CM | POA: Diagnosis not present

## 2022-04-08 DIAGNOSIS — R438 Other disturbances of smell and taste: Secondary | ICD-10-CM | POA: Diagnosis not present

## 2022-04-08 DIAGNOSIS — J324 Chronic pansinusitis: Secondary | ICD-10-CM | POA: Diagnosis not present

## 2022-04-08 DIAGNOSIS — J339 Nasal polyp, unspecified: Secondary | ICD-10-CM | POA: Diagnosis not present

## 2022-04-08 DIAGNOSIS — J45909 Unspecified asthma, uncomplicated: Secondary | ICD-10-CM | POA: Diagnosis not present

## 2022-04-08 DIAGNOSIS — J343 Hypertrophy of nasal turbinates: Secondary | ICD-10-CM | POA: Diagnosis not present

## 2022-04-08 DIAGNOSIS — Z83719 Family history of colon polyps, unspecified: Secondary | ICD-10-CM | POA: Diagnosis not present

## 2022-04-08 DIAGNOSIS — H9190 Unspecified hearing loss, unspecified ear: Secondary | ICD-10-CM | POA: Diagnosis not present

## 2022-04-08 DIAGNOSIS — H7411 Adhesive right middle ear disease: Secondary | ICD-10-CM | POA: Diagnosis not present

## 2022-04-08 DIAGNOSIS — J342 Deviated nasal septum: Secondary | ICD-10-CM | POA: Diagnosis not present

## 2022-04-08 DIAGNOSIS — Z886 Allergy status to analgesic agent status: Secondary | ICD-10-CM | POA: Diagnosis not present

## 2022-04-08 DIAGNOSIS — M950 Acquired deformity of nose: Secondary | ICD-10-CM | POA: Diagnosis not present

## 2022-04-08 DIAGNOSIS — R439 Unspecified disturbances of smell and taste: Secondary | ICD-10-CM | POA: Diagnosis not present

## 2022-04-08 DIAGNOSIS — J3489 Other specified disorders of nose and nasal sinuses: Secondary | ICD-10-CM | POA: Diagnosis not present

## 2022-05-25 DIAGNOSIS — R0981 Nasal congestion: Secondary | ICD-10-CM | POA: Diagnosis not present

## 2022-05-25 DIAGNOSIS — H938X2 Other specified disorders of left ear: Secondary | ICD-10-CM | POA: Diagnosis not present

## 2022-05-25 DIAGNOSIS — H6993 Unspecified Eustachian tube disorder, bilateral: Secondary | ICD-10-CM | POA: Diagnosis not present

## 2022-05-25 DIAGNOSIS — H6122 Impacted cerumen, left ear: Secondary | ICD-10-CM | POA: Diagnosis not present

## 2022-05-25 DIAGNOSIS — H906 Mixed conductive and sensorineural hearing loss, bilateral: Secondary | ICD-10-CM | POA: Diagnosis not present

## 2022-05-25 DIAGNOSIS — H7411 Adhesive right middle ear disease: Secondary | ICD-10-CM | POA: Diagnosis not present

## 2022-10-18 DIAGNOSIS — J3089 Other allergic rhinitis: Secondary | ICD-10-CM | POA: Diagnosis not present

## 2022-11-03 DIAGNOSIS — E782 Mixed hyperlipidemia: Secondary | ICD-10-CM | POA: Diagnosis not present

## 2022-11-03 DIAGNOSIS — A630 Anogenital (venereal) warts: Secondary | ICD-10-CM | POA: Diagnosis not present

## 2022-11-03 DIAGNOSIS — E291 Testicular hypofunction: Secondary | ICD-10-CM | POA: Diagnosis not present

## 2022-11-03 DIAGNOSIS — Z202 Contact with and (suspected) exposure to infections with a predominantly sexual mode of transmission: Secondary | ICD-10-CM | POA: Diagnosis not present

## 2022-11-03 DIAGNOSIS — D582 Other hemoglobinopathies: Secondary | ICD-10-CM | POA: Diagnosis not present

## 2022-11-03 DIAGNOSIS — N529 Male erectile dysfunction, unspecified: Secondary | ICD-10-CM | POA: Diagnosis not present

## 2022-11-03 DIAGNOSIS — Z0000 Encounter for general adult medical examination without abnormal findings: Secondary | ICD-10-CM | POA: Diagnosis not present

## 2022-11-10 ENCOUNTER — Other Ambulatory Visit: Payer: Self-pay | Admitting: Family Medicine

## 2022-11-10 DIAGNOSIS — N5089 Other specified disorders of the male genital organs: Secondary | ICD-10-CM

## 2022-12-01 ENCOUNTER — Ambulatory Visit
Admission: RE | Admit: 2022-12-01 | Discharge: 2022-12-01 | Disposition: A | Discharge status: Home / Self Care | Payer: BC Managed Care – PPO | Source: Ambulatory Visit | Attending: Family Medicine | Admitting: Family Medicine

## 2022-12-01 DIAGNOSIS — I861 Scrotal varices: Secondary | ICD-10-CM | POA: Diagnosis not present

## 2022-12-01 DIAGNOSIS — N5089 Other specified disorders of the male genital organs: Secondary | ICD-10-CM

## 2023-08-08 DIAGNOSIS — R0981 Nasal congestion: Secondary | ICD-10-CM | POA: Diagnosis not present

## 2023-08-08 DIAGNOSIS — A630 Anogenital (venereal) warts: Secondary | ICD-10-CM | POA: Diagnosis not present

## 2023-12-22 ENCOUNTER — Emergency Department: Payer: Self-pay

## 2023-12-22 ENCOUNTER — Other Ambulatory Visit: Payer: Self-pay

## 2023-12-22 ENCOUNTER — Emergency Department
Admission: EM | Admit: 2023-12-22 | Discharge: 2023-12-22 | Disposition: A | Discharge status: Home / Self Care | Payer: Worker's Compensation | Attending: Emergency Medicine | Admitting: Emergency Medicine

## 2023-12-22 DIAGNOSIS — S0993XA Unspecified injury of face, initial encounter: Secondary | ICD-10-CM | POA: Diagnosis not present

## 2023-12-22 DIAGNOSIS — S0990XA Unspecified injury of head, initial encounter: Secondary | ICD-10-CM | POA: Diagnosis not present

## 2023-12-22 DIAGNOSIS — S0001XA Abrasion of scalp, initial encounter: Secondary | ICD-10-CM | POA: Insufficient documentation

## 2023-12-22 DIAGNOSIS — J45909 Unspecified asthma, uncomplicated: Secondary | ICD-10-CM | POA: Insufficient documentation

## 2023-12-22 DIAGNOSIS — W228XXA Striking against or struck by other objects, initial encounter: Secondary | ICD-10-CM | POA: Insufficient documentation

## 2023-12-22 DIAGNOSIS — Y990 Civilian activity done for income or pay: Secondary | ICD-10-CM | POA: Insufficient documentation

## 2023-12-22 DIAGNOSIS — S0083XA Contusion of other part of head, initial encounter: Secondary | ICD-10-CM | POA: Diagnosis not present

## 2023-12-22 DIAGNOSIS — J321 Chronic frontal sinusitis: Secondary | ICD-10-CM | POA: Diagnosis not present

## 2023-12-22 MED ORDER — BACITRACIN ZINC 500 UNIT/GM EX OINT
TOPICAL_OINTMENT | Freq: Once | CUTANEOUS | Status: AC
  Administered 2023-12-22: 1 via TOPICAL
  Filled 2023-12-22: qty 0.9

## 2023-12-22 NOTE — ED Triage Notes (Addendum)
 Pt to ED via POV from work. Pt reports was working with a nitrogen air tank. Pt reports something flew up and hit him the left side of his head. Pt reports hose wrapped around and hit him in the lip and front of face.   05/11/2020 last tetanus

## 2023-12-22 NOTE — Discharge Instructions (Signed)
 Keep the area as clean and dry as possible Apply ice to the affected areas Tylenol or ibuprofen for pain as needed

## 2023-12-22 NOTE — ED Notes (Signed)
 Patient transported to CT

## 2023-12-22 NOTE — ED Provider Notes (Signed)
 [facility] Provider Note    Event Date/Time   First MD Initiated Contact with Patient 12/22/23 1157     (approximate)   History   Head Injury   HPI  Erik Hodge is a 53 y.o. male with history of asthma, presents emergency department after a head injury at work.  Patient was working with a nitrogen tank when the pressure valve broke at 500 PSI and hit him in the head.  Unsure if it was the hose or the valve that hit him.  Has an abrasion on his scalp.  No LOC but does have a headache and states his left jaw and upper lip hurt.  No tooth pain.  Tdap is up-to-date      Physical Exam   Triage Vital Signs: ED Triage Vitals [12/22/23 1032]  Encounter Vitals Group     BP 139/74     Girls Systolic BP Percentile      Girls Diastolic BP Percentile      Boys Systolic BP Percentile      Boys Diastolic BP Percentile      Pulse Rate 74     Resp 20     Temp 99 F (37.2 C)     Temp Source Oral     SpO2 98 %     Weight 205 lb (93 kg)     Height 5' 11 (1.803 m)     Head Circumference      Peak Flow      Pain Score 5     Pain Loc      Pain Education      Exclude from Growth Chart     Most recent vital signs: Vitals:   12/22/23 1032  BP: 139/74  Pulse: 74  Resp: 20  Temp: 99 F (37.2 C)  SpO2: 98%     General: Awake, no distress.   CV:  Good peripheral perfusion.  Resp:  Normal effort. Abd:  No distention.   Other:  Abrasion noted to the left side of head, swelling noted left side of head, area is tender to palpation, left TMJ area tender to palpation, left zygomatic area tender to palpation, left lip is swollen, teeth are intact, neck is supple   ED Results / Procedures / Treatments   Labs (all labs ordered are listed, but only abnormal results are displayed) Labs Reviewed - No data to display   EKG     RADIOLOGY CT head and maxillofacial    PROCEDURES:   Procedures  Critical Care:  no Chief Complaint  Patient  presents with   Head Injury      MEDICATIONS ORDERED IN ED: Medications  bacitracin ointment (has no administration in time range)     IMPRESSION / MDM / ASSESSMENT AND PLAN / ED COURSE  I reviewed the triage vital signs and the nursing notes.                              Differential diagnosis includes, but is not limited to, subdural, SAH, skull fracture, contusion, abrasion, facial bone fracture, contusion  Patient's presentation is most consistent with acute illness / injury with system symptoms.   CT head, maxillofacial ordered due to mechanism of injury  CT of the head, maxillofacial independently reviewed interpreted by me as being negative for acute abnormality  Did explain findings to patient.  Mostly has an abrasion to the left side of the  head with a contusion.  Clean the area with normal saline.  Will have nursing staff apply bacitracin.  Patient can return to work to full duty tomorrow.  Head instructions given.  He is in agreement treatment plan.  Discharged stable condition.     FINAL CLINICAL IMPRESSION(S) / ED DIAGNOSES   Final diagnoses:  Injury of head, initial encounter  Abrasion of scalp, initial encounter  Contusion of face, initial encounter     Rx / DC Orders   ED Discharge Orders     None        Note:  This document was prepared using Dragon voice recognition software and may include unintentional dictation errors.    Gasper Devere ORN, PA-C 12/22/23 1249    Levander Slate, MD 12/22/23 [phone]

## 2024-04-02 DIAGNOSIS — H179 Unspecified corneal scar and opacity: Secondary | ICD-10-CM | POA: Diagnosis not present

## 2024-04-02 DIAGNOSIS — H5334 Suppression of binocular vision: Secondary | ICD-10-CM | POA: Diagnosis not present
